# Patient Record
Sex: Male | Born: 1995 | Race: Black or African American | Hispanic: No | Marital: Single | Smoking: Never smoker
Health system: Southern US, Community
[De-identification: ages and names within clinical notes are randomized; demographics above are authoritative.]

---
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
## 2015-07-17 ENCOUNTER — Emergency Department (HOSPITAL_COMMUNITY)
Admission: EM | Admit: 2015-07-17 | Discharge: 2015-07-17 | Disposition: A | Discharge status: Home / Self Care | Payer: Medicaid Other | Attending: Emergency Medicine | Admitting: Emergency Medicine

## 2015-07-17 ENCOUNTER — Encounter (HOSPITAL_COMMUNITY): Payer: Self-pay | Admitting: Emergency Medicine

## 2015-07-17 DIAGNOSIS — J029 Acute pharyngitis, unspecified: Secondary | ICD-10-CM

## 2015-07-17 LAB — RAPID STREP SCREEN (MED CTR MEBANE ONLY): Streptococcus, Group A Screen (Direct): NEGATIVE

## 2015-07-17 MED ORDER — IBUPROFEN 600 MG PO TABS: 600.0000 mg | ORAL_TABLET | Freq: Three times a day (TID) | ORAL | Status: AC | PRN

## 2015-07-17 MED ORDER — IBUPROFEN 800 MG PO TABS
800.0000 mg | ORAL_TABLET | Freq: Once | ORAL | Status: AC
  Administered 2015-07-17: 800 mg via ORAL
  Filled 2015-07-17: qty 1

## 2015-07-17 NOTE — ED Notes (Signed)
Pt complains of throat pain since last Tuesday. Pt reports that it hurts to swallow. Denies fevers, chills, body aches and cough.

## 2015-07-17 NOTE — ED Provider Notes (Signed)
CSN: [id number]     Arrival date & time 07/17/15  4098 History   First MD Initiated Contact with Patient 07/17/15 [phone]     Chief Complaint  Patient presents with  . Sore Throat     (Consider location/radiation/quality/duration/timing/severity/associated sxs/prior Treatment) Patient is a 20 y.o. male presenting with pharyngitis. The history is provided by the patient.  Sore Throat This is a new problem. Episode onset: started 9 days ago, sx free ytd, then woke today sore again. The problem occurs constantly. The problem has been gradually improving. Associated symptoms include a sore throat. Pertinent negatives include no abdominal pain, chest pain, chills, congestion, coughing, fever, myalgias, rash, swollen glands, vomiting or weakness. The symptoms are aggravated by swallowing. He has tried acetaminophen for the symptoms. The treatment provided mild relief.    History reviewed. No pertinent past medical history. Past Surgical History  Procedure Laterality Date  . Cystoscopy with urethral dilatation N/A 06/07/2013    Procedure: CYSTOSCOPY WITH URETHRAL DILATATION WITH FULGURATION OF PROSTATE;  Surgeon: Ky Barban, MD;  Location: [facility];  Service: Urology;  Laterality: N/A;  . Cystoscopy with biopsy N/A 06/07/2013    Procedure: CYSTOSCOPY WITH BIOPSY;  Surgeon: Ky Barban, MD;  Location: [facility];  Service: Urology;  Laterality: N/A;   History reviewed. No pertinent family history. Social History  Substance Use Topics  . Smoking status: Never Smoker   . Smokeless tobacco: None  . Alcohol Use: No    Review of Systems  Constitutional: Negative for fever and chills.  HENT: Positive for sore throat. Negative for congestion, ear pain, rhinorrhea, sinus pressure, trouble swallowing and voice change.   Eyes: Negative for discharge.  Respiratory: Negative for cough, shortness of breath, wheezing and stridor.   Cardiovascular: Negative for chest pain.  Gastrointestinal: Negative  for vomiting and abdominal pain.  Genitourinary: Negative.   Musculoskeletal: Negative for myalgias.  Skin: Negative for rash.  Neurological: Negative for weakness.      Allergies  Review of patient's allergies indicates no known allergies.  Home Medications   Prior to Admission medications   Medication Sig Start Date End Date Taking? Authorizing Provider  ibuprofen (ADVIL,MOTRIN) 600 MG tablet Take 1 tablet (600 mg total) by mouth every 8 (eight) hours as needed for moderate pain. 07/17/15   Burgess Amor, PA-C   BP 135/82 mmHg  Pulse 60  Temp(Src) 98 F (36.7 C) (Oral)  Resp 16  Ht  (1.778 m)  Wt 78.019 kg  BMI 24.68 kg/m2  SpO2 100% Physical Exam  Constitutional: He is oriented to person, place, and time. He appears well-developed and well-nourished.  HENT:  Head: Normocephalic and atraumatic.  Right Ear: Tympanic membrane and ear canal normal.  Left Ear: Tympanic membrane and ear canal normal.  Nose: No mucosal edema or rhinorrhea.  Mouth/Throat: Uvula is midline and mucous membranes are normal. No trismus in the jaw. Posterior oropharyngeal erythema present. No oropharyngeal exudate, posterior oropharyngeal edema or tonsillar abscesses.  Mild posterior pharyngeal erythema. No tonsillar edema. No exudate or petechiae.   Eyes: Conjunctivae are normal.  Cardiovascular: Normal rate and normal heart sounds.   Pulmonary/Chest: Effort normal. No respiratory distress. He has no wheezes. He has no rales.  Musculoskeletal: Normal range of motion.  Lymphadenopathy:    He has no cervical adenopathy.  No head or neck adenopathy.  Neurological: He is alert and oriented to person, place, and time.  Skin: Skin is warm and dry. No rash noted.  Psychiatric: He  has a normal mood and affect.    ED Course  Procedures (including critical care time) Labs Review Labs Reviewed  RAPID STREP SCREEN (NOT AT [facility])  CULTURE, GROUP A STREP [facility])      MDM   Final diagnoses:   Viral pharyngitis    Strep negative, discussed with patient.  Culture pending, suspect viral pharygitis. He was given ibuprofen while here with near complete resolution of throat pain. Advised to continue this medicine, throat lozenges, increased fluids, rest.  Prn f/u anticipated.   Burgess AmorJulie Londen Bok, PA-C 07/17/15 2104  Vanetta MuldersScott Zackowski, MD 07/18/15 [id number]

## 2015-07-17 NOTE — Discharge Instructions (Signed)
Pharyngitis Pharyngitis is redness, pain, and swelling (inflammation) of your pharynx.  CAUSES  Pharyngitis is usually caused by infection. Most of the time, these infections are from viruses (viral) and are part of a cold. However, sometimes pharyngitis is caused by bacteria (bacterial). Pharyngitis can also be caused by allergies. Viral pharyngitis may be spread from person to person by coughing, sneezing, and personal items or utensils (cups, forks, spoons, toothbrushes). Bacterial pharyngitis may be spread from person to person by more intimate contact, such as kissing.  SIGNS AND SYMPTOMS  Symptoms of pharyngitis include:   Sore throat.   Tiredness (fatigue).   Low-grade fever.   Headache.  Joint pain and muscle aches.  Skin rashes.  Swollen lymph nodes.  Plaque-like film on throat or tonsils (often seen with bacterial pharyngitis). DIAGNOSIS  Your health care provider will ask you questions about your illness and your symptoms. Your medical history, along with a physical exam, is often all that is needed to diagnose pharyngitis. Sometimes, a rapid strep test is done. Other lab tests may also be done, depending on the suspected cause.  TREATMENT  Viral pharyngitis will usually get better in 3-4 days without the use of medicine. Bacterial pharyngitis is treated with medicines that kill germs (antibiotics).  HOME CARE INSTRUCTIONS   Drink enough water and fluids to keep your urine clear or pale yellow.   Only take over-the-counter or prescription medicines as directed by your health care provider:   If you are prescribed antibiotics, make sure you finish them even if you start to feel better.   Do not take aspirin.   Get lots of rest.   Gargle with 8 oz of salt water ( tsp of salt per 1 qt of water) as often as every 1-2 hours to soothe your throat.   Throat lozenges (if you are not at risk for choking) or sprays may be used to soothe your throat. SEEK MEDICAL  CARE IF:   You have large, tender lumps in your neck.  You have a rash.  You cough up green, yellow-brown, or bloody spit. SEEK IMMEDIATE MEDICAL CARE IF:   Your neck becomes stiff.  You drool or are unable to swallow liquids.  You vomit or are unable to keep medicines or liquids down.  You have severe pain that does not go away with the use of recommended medicines.  You have trouble breathing (not caused by a stuffy nose). MAKE SURE YOU:   Understand these instructions.  Will watch your condition.  Will get help right away if you are not doing well or get worse.   This information is not intended to replace advice given to you by your health care provider. Make sure you discuss any questions you have with your health care provider.   Document Released: 04/12/2005 Document Revised: 01/31/2013 Document Reviewed: 12/18/2012 Elsevier Interactive Patient Education 2016 [employer]   Your strep test is negative today for a bacterial source of your sore throat pain.  Your symptoms are most likely a virus (which is contagious) but should resolve on its own with a little more time.  Cough lozenges, warm tea with lemon and honey can be soothing.  Ibuprofen can also help with throat pain.

## 2015-07-19 LAB — CULTURE, GROUP A STREP (THRC)

## 2018-05-31 ENCOUNTER — Emergency Department (HOSPITAL_COMMUNITY)
Admission: EM | Admit: 2018-05-31 | Discharge: 2018-05-31 | Disposition: A | Discharge status: Home / Self Care | Payer: 59 | Attending: Emergency Medicine | Admitting: Emergency Medicine

## 2018-05-31 ENCOUNTER — Other Ambulatory Visit: Payer: Self-pay

## 2018-05-31 ENCOUNTER — Encounter (HOSPITAL_COMMUNITY): Payer: Self-pay | Admitting: Emergency Medicine

## 2018-05-31 DIAGNOSIS — R109 Unspecified abdominal pain: Secondary | ICD-10-CM | POA: Diagnosis not present

## 2018-05-31 DIAGNOSIS — Z5321 Procedure and treatment not carried out due to patient leaving prior to being seen by health care provider: Secondary | ICD-10-CM | POA: Insufficient documentation

## 2018-05-31 LAB — COMPREHENSIVE METABOLIC PANEL
ALT: 13 U/L (ref 0–44)
AST: 21 U/L (ref 15–41)
Albumin: 4.2 g/dL (ref 3.5–5.0)
Alkaline Phosphatase: 42 U/L (ref 38–126)
Anion gap: 7 (ref 5–15)
BILIRUBIN TOTAL: 0.8 mg/dL (ref 0.3–1.2)
BUN: 13 mg/dL (ref 6–20)
CO2: 25 mmol/L (ref 22–32)
CREATININE: 1.22 mg/dL (ref 0.61–1.24)
Calcium: 8.8 mg/dL — ABNORMAL LOW (ref 8.9–10.3)
Chloride: 104 mmol/L (ref 98–111)
Glucose, Bld: 91 mg/dL (ref 70–99)
Potassium: 3.4 mmol/L — ABNORMAL LOW (ref 3.5–5.1)
Sodium: 136 mmol/L (ref 135–145)
TOTAL PROTEIN: 7.1 g/dL (ref 6.5–8.1)

## 2018-05-31 LAB — URINALYSIS, ROUTINE W REFLEX MICROSCOPIC
Bacteria, UA: NONE SEEN
Bilirubin Urine: NEGATIVE
Glucose, UA: NEGATIVE mg/dL
Ketones, ur: NEGATIVE mg/dL
NITRITE: NEGATIVE
PH: 5 (ref 5.0–8.0)
Protein, ur: 30 mg/dL — AB
SPECIFIC GRAVITY, URINE: 1.033 — AB (ref 1.005–1.030)
WBC, UA: >50 WBC/hpf — ABNORMAL HIGH (ref 0–5)

## 2018-05-31 LAB — CBC
HEMATOCRIT: 48 % (ref 39.0–52.0)
Hemoglobin: 15.9 g/dL (ref 13.0–17.0)
MCH: 28.5 pg (ref 26.0–34.0)
MCHC: 33.1 g/dL (ref 30.0–36.0)
MCV: 86.2 fL (ref 80.0–100.0)
NRBC: 0 % (ref 0.0–0.2)
PLATELETS: 293 10*3/uL (ref 150–400)
RBC: 5.57 MIL/uL (ref 4.22–5.81)
RDW: 12.4 % (ref 11.5–15.5)
WBC: 6.5 10*3/uL (ref 4.0–10.5)

## 2018-05-31 LAB — LIPASE, BLOOD: LIPASE: 23 U/L (ref 11–51)

## 2018-05-31 MED ORDER — SODIUM CHLORIDE 0.9% FLUSH: 3.0000 mL | Freq: Once | INTRAVENOUS | Status: DC

## 2018-05-31 NOTE — ED Triage Notes (Signed)
Vomiting and diarrhea since yesterday after eating at cookout

## 2018-05-31 NOTE — ED Notes (Signed)
Registration called and stated that pt left from waiting room.

## 2018-06-01 ENCOUNTER — Ambulatory Visit (HOSPITAL_COMMUNITY)
Admission: EM | Admit: 2018-06-01 | Discharge: 2018-06-01 | Disposition: A | Discharge status: Home / Self Care | Payer: 59 | Attending: Family Medicine | Admitting: Family Medicine

## 2018-06-01 ENCOUNTER — Encounter (HOSPITAL_COMMUNITY): Payer: Self-pay | Admitting: Family Medicine

## 2018-06-01 DIAGNOSIS — A084 Viral intestinal infection, unspecified: Secondary | ICD-10-CM

## 2018-06-01 MED ORDER — ONDANSETRON 4 MG PO TBDP: 4.0000 mg | ORAL_TABLET | Freq: Three times a day (TID) | ORAL | 0 refills | Status: AC | PRN

## 2018-06-01 NOTE — Discharge Instructions (Addendum)
I believe that this is either a stomach virus or food poisoning.  Zofran as needed for nausea, vomiting Advance diet as tolerated.  Sip fluids  Follow up as needed for continued or worsening symptoms

## 2018-06-01 NOTE — ED Provider Notes (Signed)
[facility]    CSN: [id number] Arrival date & time: 06/01/18  0802     History   Chief Complaint No chief complaint on file.   HPI Dorena CookeyJamond D [place] is a 23 y.o. male.   Patient is a 23 year old male with no significant past medical history.  He presents today with nausea, vomiting, diarrhea.  This is been present and somewhat improved since Sunday.  Reports that his symptoms started after eating cookout.  He has been able to drink water and orange juice without vomiting.  The vomiting happens after he tries to eat certain foods.  He has been eating approximately 1 small meal a day.  He has not taken any medications for his symptoms.  He denies any recent traveling.  He does work 2 jobs and works with [employer].  He denies any associated fever, myalgias.  ROS per HPI      History reviewed. No pertinent past medical history.  There are no active problems to display for this patient.   Past Surgical History:  Procedure Laterality Date  . CYSTOSCOPY WITH BIOPSY N/A 06/07/2013   Procedure: CYSTOSCOPY WITH BIOPSY;  Surgeon: Ky BarbanMohammad I Javaid, MD;  Location: [facility];  Service: Urology;  Laterality: N/A;  . CYSTOSCOPY WITH URETHRAL DILATATION N/A 06/07/2013   Procedure: CYSTOSCOPY WITH URETHRAL DILATATION WITH FULGURATION OF PROSTATE;  Surgeon: Ky BarbanMohammad I Javaid, MD;  Location: [facility];  Service: Urology;  Laterality: N/A;       Home Medications    Prior to Admission medications   Medication Sig Start Date End Date Taking? Authorizing Provider  ibuprofen (ADVIL,MOTRIN) 600 MG tablet Take 1 tablet (600 mg total) by mouth every 8 (eight) hours as needed for moderate pain. 07/17/15   Burgess AmorIdol, Julie, PA-C  ondansetron (ZOFRAN ODT) 4 MG disintegrating tablet Take 1 tablet (4 mg total) by mouth every 8 (eight) hours as needed for nausea or vomiting. 06/01/18   Janace ArisBast, Geral Tuch A, NP    Family History No family history on file.  Social History Social History   Tobacco Use  .  Smoking status: Never Smoker  . Smokeless tobacco: Never Used  Substance Use Topics  . Alcohol use: No  . Drug use: No     Allergies   Patient has no known allergies.   Review of Systems Review of Systems   Physical Exam Triage Vital Signs ED Triage Vitals [06/01/18 0815]  Enc Vitals Group     BP 122/65     Pulse Rate 69     Resp 16     Temp 98.4 F (36.9 C)     Temp Source Oral     SpO2 99 %     Weight      Height      Head Circumference      Peak Flow      Pain Score      Pain Loc      Pain Edu?      Excl. in GC?    No data found.  Updated Vital Signs BP 122/65 (BP Location: Left Arm)   Pulse 69   Temp 98.4 F (36.9 C) (Oral)   Resp 16   SpO2 99%   Visual Acuity Right Eye Distance:   Left Eye Distance:   Bilateral Distance:    Right Eye Near:   Left Eye Near:    Bilateral Near:     Physical Exam Vitals signs and nursing note reviewed.  Constitutional:  General: He is not in acute distress.    Appearance: Normal appearance. He is well-developed and normal weight. He is not ill-appearing, toxic-appearing or diaphoretic.  HENT:     Head: Normocephalic and atraumatic.     Nose: Nose normal.  Eyes:     Conjunctiva/sclera: Conjunctivae normal.  Neck:     Musculoskeletal: Normal range of motion.  Cardiovascular:     Rate and Rhythm: Normal rate and regular rhythm.     Heart sounds: No murmur.  Pulmonary:     Effort: Pulmonary effort is normal. No respiratory distress.     Breath sounds: Normal breath sounds.  Abdominal:     Palpations: Abdomen is soft.     Tenderness: There is no abdominal tenderness.  Musculoskeletal: Normal range of motion.  Skin:    General: Skin is warm and dry.  Neurological:     Mental Status: He is alert.  Psychiatric:        Mood and Affect: Mood normal.      UC Treatments / Results  Labs (all labs ordered are listed, but only abnormal results are displayed) Labs Reviewed - No data to  display  EKG None  Radiology No results found.  Procedures Procedures (including critical care time)  Medications Ordered in UC Medications - No data to display  Initial Impression / Assessment and Plan / UC Course  I have reviewed the triage vital signs and the nursing notes.  Pertinent labs & imaging results that were available during my care of the patient were reviewed by me and considered in my medical decision making (see chart for details).     Patient is a 23 year old male with nausea, vomiting, diarrhea since Sunday.  This started after eating fast food. Most likely symptoms are related to either a virus or food poisoning.  No acute abdomen on exam.  Vital signs stable, nontoxic or ill-appearing.  We will give Zofran for nausea, vomiting to use as needed Work note given  Final Clinical Impressions(s) / UC Diagnoses   Final diagnoses:  Viral gastroenteritis     Discharge Instructions     I believe that this is either a stomach virus or food poisoning.  Zofran as needed for nausea, vomiting Advance diet as tolerated.  Sip fluids  Follow up as needed for continued or worsening symptoms     ED Prescriptions    Medication Sig Dispense Auth. Provider   ondansetron (ZOFRAN ODT) 4 MG disintegrating tablet Take 1 tablet (4 mg total) by mouth every 8 (eight) hours as needed for nausea or vomiting. 20 tablet Dahlia Byes A, NP     Controlled Substance Prescriptions [place] Controlled Substance Registry consulted? Not Applicable   Janace Aris, NP 06/01/18 [phone]

## 2019-06-04 ENCOUNTER — Ambulatory Visit: Payer: Self-pay | Attending: Internal Medicine

## 2019-06-04 ENCOUNTER — Other Ambulatory Visit: Payer: Self-pay

## 2019-06-04 DIAGNOSIS — Z20822 Contact with and (suspected) exposure to covid-19: Secondary | ICD-10-CM | POA: Insufficient documentation

## 2019-06-05 LAB — NOVEL CORONAVIRUS, NAA: SARS-CoV-2, NAA: NOT DETECTED

## 2020-05-02 ENCOUNTER — Other Ambulatory Visit: Payer: Self-pay

## 2020-05-02 DIAGNOSIS — Z20822 Contact with and (suspected) exposure to covid-19: Secondary | ICD-10-CM

## 2020-05-05 LAB — NOVEL CORONAVIRUS, NAA: SARS-CoV-2, NAA: DETECTED — AB

## 2020-10-16 ENCOUNTER — Other Ambulatory Visit: Payer: Self-pay

## 2020-10-16 ENCOUNTER — Emergency Department (HOSPITAL_COMMUNITY)
Admission: EM | Admit: 2020-10-16 | Discharge: 2020-10-16 | Disposition: A | Discharge status: Home / Self Care | Payer: No Typology Code available for payment source | Attending: Emergency Medicine | Admitting: Emergency Medicine

## 2020-10-16 ENCOUNTER — Emergency Department (HOSPITAL_COMMUNITY): Payer: No Typology Code available for payment source

## 2020-10-16 ENCOUNTER — Encounter (HOSPITAL_COMMUNITY): Payer: Self-pay

## 2020-10-16 DIAGNOSIS — Z23 Encounter for immunization: Secondary | ICD-10-CM | POA: Insufficient documentation

## 2020-10-16 DIAGNOSIS — S0181XA Laceration without foreign body of other part of head, initial encounter: Secondary | ICD-10-CM | POA: Insufficient documentation

## 2020-10-16 MED ORDER — LIDOCAINE HCL (PF) 1 % IJ SOLN
INTRAMUSCULAR | Status: DC
Start: 2020-10-16 — End: 2020-10-17
  Filled 2020-10-16: qty 5

## 2020-10-16 MED ORDER — CEPHALEXIN 500 MG PO CAPS: 500.0000 mg | ORAL_CAPSULE | Freq: Two times a day (BID) | ORAL | 0 refills | Status: AC

## 2020-10-16 MED ORDER — LIDOCAINE HCL (PF) 1 % IJ SOLN
INTRAMUSCULAR | Status: AC
  Filled 2020-10-16: qty 5

## 2020-10-16 MED ORDER — TETANUS-DIPHTH-ACELL PERTUSSIS 5-2.5-18.5 LF-MCG/0.5 IM SUSY
0.5000 mL | PREFILLED_SYRINGE | Freq: Once | INTRAMUSCULAR | Status: AC
  Administered 2020-10-16: 0.5 mL via INTRAMUSCULAR
  Filled 2020-10-16: qty 0.5

## 2020-10-16 MED ORDER — LIDOCAINE HCL (PF) 1 % IJ SOLN
5.0000 mL | Freq: Once | INTRAMUSCULAR | Status: AC
  Administered 2020-10-16: 5 mL

## 2020-10-16 NOTE — Discharge Instructions (Addendum)
You received 15 sutures, I recommend keeping the wound dry for the first 24 hours.  After that I would like you to rinse out the wounds with clean water daily for the next 7 days.  I recommend over-the-counter pain medications like ibuprofen and/or Tylenol every 6 hours as needed please follow dosing on the back of bottle.  I start you on antibiotics please take as prescribed.  Please come back next 2 to 3 days for a wound check.  Come back to the emergency department if you develop chest pain, shortness of breath, severe abdominal pain, uncontrolled nausea, vomiting, diarrhea.
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
## 2020-10-16 NOTE — ED Notes (Signed)
Mother to registration asked to see patient, pt denied letting mother come to room at this time, registration made aware.
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
## 2020-10-16 NOTE — ED Notes (Signed)
Patient transported to X-ray 

## 2020-10-20 ENCOUNTER — Encounter (HOSPITAL_COMMUNITY): Payer: Self-pay | Admitting: *Deleted

## 2020-10-20 ENCOUNTER — Emergency Department (HOSPITAL_COMMUNITY)
Admission: EM | Admit: 2020-10-20 | Discharge: 2020-10-20 | Discharge status: Against Medical Advice | Payer: No Typology Code available for payment source | Attending: Emergency Medicine | Admitting: Emergency Medicine

## 2020-10-20 ENCOUNTER — Other Ambulatory Visit: Payer: Self-pay

## 2020-10-20 DIAGNOSIS — Z4800 Encounter for change or removal of nonsurgical wound dressing: Secondary | ICD-10-CM | POA: Insufficient documentation

## 2020-10-20 DIAGNOSIS — Z5321 Procedure and treatment not carried out due to patient leaving prior to being seen by health care provider: Secondary | ICD-10-CM | POA: Insufficient documentation

## 2020-10-20 NOTE — ED Notes (Signed)
PA informed

## 2020-10-20 NOTE — ED Provider Notes (Signed)
Patient left prior to my evaluation.  Left AGAINST MEDICAL ADVICE.  Seen leaving the ER with normal steady gait.   Mare Ferrari, PA-C 10/20/20 Cecilie Kicks, MD 10/23/20 1400
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
## 2020-10-22 ENCOUNTER — Encounter (HOSPITAL_COMMUNITY): Payer: Self-pay

## 2020-10-22 ENCOUNTER — Emergency Department (HOSPITAL_COMMUNITY)
Admission: EM | Admit: 2020-10-22 | Discharge: 2020-10-22 | Disposition: A | Discharge status: Home / Self Care | Payer: No Typology Code available for payment source | Attending: Emergency Medicine | Admitting: Emergency Medicine

## 2020-10-22 ENCOUNTER — Other Ambulatory Visit: Payer: Self-pay

## 2020-10-22 DIAGNOSIS — Z4802 Encounter for removal of sutures: Secondary | ICD-10-CM | POA: Insufficient documentation

## 2020-10-22 NOTE — ED Provider Notes (Signed)
[facility] EMERGENCY DEPARTMENT Provider Note   CSN: [id number] Arrival date & time: 10/22/20  1627     History Chief Complaint  Patient presents with   Wound Check    [name] [name] is a 25 y.o. male.  HPI  25 year old male presenting to the emergency department today for evaluation of suture removal.  Had laceration to the left jawline with sutures placed on 6/23.  He states he has had no pain and that the wound has been well-healing.  Denies any fevers or drainage from the wound.  History reviewed. No pertinent past medical history.  There are no problems to display for this patient.   Past Surgical History:  Procedure Laterality Date   CYSTOSCOPY WITH BIOPSY N/A 06/07/2013   Procedure: CYSTOSCOPY WITH BIOPSY;  Surgeon: Ky Barban, MD;  Location: [facility];  Service: Urology;  Laterality: N/A;   CYSTOSCOPY WITH URETHRAL DILATATION N/A 06/07/2013   Procedure: CYSTOSCOPY WITH URETHRAL DILATATION WITH FULGURATION OF PROSTATE;  Surgeon: Ky Barban, MD;  Location: [facility];  Service: Urology;  Laterality: N/A;       History reviewed. No pertinent family history.  Social History   Tobacco Use   Smoking status: Never   Smokeless tobacco: Never  Vaping Use   Vaping Use: Never used  Substance Use Topics   Alcohol use: No   Drug use: No    Home Medications Prior to Admission medications   Medication Sig Start Date End Date Taking? Authorizing Provider  cephALEXin (KEFLEX) 500 MG capsule Take 1 capsule (500 mg total) by mouth 2 (two) times daily for 7 days. 10/16/20 10/23/20  Carroll Sage, PA-C  ibuprofen (ADVIL,MOTRIN) 600 MG tablet Take 1 tablet (600 mg total) by mouth every 8 (eight) hours as needed for moderate pain. 07/17/15   Burgess Amor, PA-C  ondansetron (ZOFRAN ODT) 4 MG disintegrating tablet Take 1 tablet (4 mg total) by mouth every 8 (eight) hours as needed for nausea or vomiting. 06/01/18   Janace Aris, NP    Allergies    Patient has no known  allergies.  Review of Systems   Review of Systems  Constitutional:  Negative for fever.  Skin:  Positive for wound.  Neurological:  Negative for headaches.   Physical Exam Updated Vital Signs BP (!) 145/74 (BP Location: Right Arm)   Pulse 64   Temp 98.6 F (37 C) (Oral)   Resp 18   Ht 5\' 10"  (1.778 m)   Wt 76.9 kg   SpO2 98%   BMI 24.32 kg/m   Physical Exam Constitutional:      General: He is not in acute distress.    Appearance: He is well-developed.  Eyes:     Conjunctiva/sclera: Conjunctivae normal.  Cardiovascular:     Rate and Rhythm: Normal rate.  Pulmonary:     Effort: Pulmonary effort is normal.  Skin:    General: Skin is warm and dry.     Comments: Well-healing wound to the left jawline with no significant tenderness, surrounding erythema or warmth.  Neurological:     Mental Status: He is alert and oriented to person, place, and time.    ED Results / Procedures / Treatments   Labs (all labs ordered are listed, but only abnormal results are displayed) Labs Reviewed - No data to display  EKG None  Radiology No results found.  Procedures .Suture Removal  Date/Time: 10/22/2020 5:46 PM Performed by: 10/24/2020, PA-C Authorized by: Karrie Meres,  PA-C   Consent:    Consent obtained:  Verbal   Consent given by:  Patient   Risks, benefits, and alternatives were discussed: yes     Risks discussed:  Bleeding and wound separation   Alternatives discussed:  No treatment Universal protocol:    Procedure explained and questions answered to patient or proxy's satisfaction: yes     Immediately prior to procedure, a time out was called: yes     Patient identity confirmed:  Verbally with patient Location:    Location:  Head/neck Procedure details:    Wound appearance:  No signs of infection, good wound healing and clean   Number of sutures removed:  15 Post-procedure details:    Post-removal:  No dressing applied   Procedure completion:   Tolerated well, no immediate complications   Medications Ordered in ED Medications - No data to display  ED Course  I have reviewed the triage vital signs and the nursing notes.  Pertinent labs & imaging results that were available during my care of the patient were reviewed by me and considered in my medical decision making (see chart for details).    MDM Rules/Calculators/A&P                          Staple removal   Pt to ER for staple/suture removal and wound check as above. Procedure tolerated well. Vitals normal, no signs of infection. Scar minimization & return precautions given at dc.    Final Clinical Impression(s) / ED Diagnoses Final diagnoses:  Visit for suture removal    Rx / DC Orders ED Discharge Orders     None        Rayne Du 10/22/20 1747    Bethann Berkshire, MD 10/24/20 1032

## 2020-10-22 NOTE — ED Notes (Signed)
Stitches removed . No drainage noted.

## 2020-10-22 NOTE — ED Triage Notes (Signed)
Pt to er, pt states that Thursday he had stitches put in after he was cut with a glass bottle, states that Monday he was told to come in for a wound check, states that he wasn't able to come Monday, so he is here today.

## 2022-06-03 ENCOUNTER — Emergency Department (HOSPITAL_BASED_OUTPATIENT_CLINIC_OR_DEPARTMENT_OTHER): Payer: Self-pay

## 2022-06-03 ENCOUNTER — Other Ambulatory Visit: Payer: Self-pay

## 2022-06-03 ENCOUNTER — Encounter (HOSPITAL_BASED_OUTPATIENT_CLINIC_OR_DEPARTMENT_OTHER): Payer: Self-pay

## 2022-06-03 ENCOUNTER — Emergency Department (HOSPITAL_BASED_OUTPATIENT_CLINIC_OR_DEPARTMENT_OTHER)
Admission: EM | Admit: 2022-06-03 | Discharge: 2022-06-03 | Disposition: A | Discharge status: Home / Self Care | Payer: Worker's Compensation | Attending: Emergency Medicine | Admitting: Emergency Medicine

## 2022-06-03 DIAGNOSIS — S91111A Laceration without foreign body of right great toe without damage to nail, initial encounter: Secondary | ICD-10-CM | POA: Diagnosis not present

## 2022-06-03 DIAGNOSIS — Y990 Civilian activity done for income or pay: Secondary | ICD-10-CM | POA: Diagnosis not present

## 2022-06-03 DIAGNOSIS — S97111A Crushing injury of right great toe, initial encounter: Secondary | ICD-10-CM

## 2022-06-03 DIAGNOSIS — W3182XA Contact with other commercial machinery, initial encounter: Secondary | ICD-10-CM | POA: Diagnosis not present

## 2022-06-03 MED ORDER — TRAMADOL HCL 50 MG PO TABS: 50.0000 mg | ORAL_TABLET | Freq: Four times a day (QID) | ORAL | 0 refills | Status: AC | PRN

## 2022-06-03 MED ORDER — CEPHALEXIN 500 MG PO CAPS: 500.0000 mg | ORAL_CAPSULE | Freq: Four times a day (QID) | ORAL | 0 refills | Status: AC

## 2022-06-03 MED ORDER — LIDOCAINE-EPINEPHRINE (PF) 2 %-1:200000 IJ SOLN
20.0000 mL | Freq: Once | INTRAMUSCULAR | Status: DC
  Filled 2022-06-03: qty 20

## 2022-06-03 NOTE — ED Triage Notes (Signed)
States the bucket on a bobcat and he was scooping mulch into it and it didn't have the brake on it and it was rolling and caught his foot. States great toe nail on right foot is coming off.

## 2022-06-03 NOTE — ED Provider Notes (Signed)
[place] EMERGENCY DEPARTMENT AT [facility] Provider Note   CSN: [id number] Arrival date & time: 06/03/22  1558     History  Chief Complaint  Patient presents with   Foot Pain    [name] [name] is a 27 y.o. male complaining of injury to the right great toe that occurred while he was at work.  He states that the bucket of a bobcat was not fully down to the ground and the brake was not on causing it to roll forward and catch his foot.  He states it happened so quickly he was unable to pull his foot away.  He has pain in his great toe and is concerned that his toenail is coming off.  Denies any other injury from the incident, numbness or tingling in the foot, or difficulty controlling the bleeding.  He does not take anticoagulation.  He is up-to-date on tetanus immunization.         Home Medications Prior to Admission medications   Medication Sig Start Date End Date Taking? Authorizing Provider  cephALEXin (KEFLEX) 500 MG capsule Take 1 capsule (500 mg total) by mouth 4 (four) times daily. 06/03/22  Yes Danijah Noh R, PA  traMADol (ULTRAM) 50 MG tablet Take 1 tablet (50 mg total) by mouth every 6 (six) hours as needed. 06/03/22  Yes Elianys Conry R, PA  ibuprofen (ADVIL,MOTRIN) 600 MG tablet Take 1 tablet (600 mg total) by mouth every 8 (eight) hours as needed for moderate pain. 07/17/15   Evalee Jefferson, PA-C  ondansetron (ZOFRAN ODT) 4 MG disintegrating tablet Take 1 tablet (4 mg total) by mouth every 8 (eight) hours as needed for nausea or vomiting. 06/01/18   Orvan July, NP      Allergies    Patient has no known allergies.    Review of Systems   Review of Systems  Skin:  Positive for wound (Right great toe).    Physical Exam Updated Vital Signs BP (!) 148/98 (BP Location: Left Arm)   Pulse 76   Temp 98.3 F (36.8 C) (Oral)   Resp 18   Ht 5' 10"$  (1.778 m)   Wt 76.7 kg   SpO2 100%   BMI 24.25 kg/m  Physical Exam Vitals and nursing note reviewed.   Constitutional:      General: He is not in acute distress.    Appearance: Normal appearance. He is not ill-appearing or diaphoretic.  Cardiovascular:     Rate and Rhythm: Normal rate and regular rhythm.  Pulmonary:     Effort: Pulmonary effort is normal.  Musculoskeletal:     Right foot: Normal range of motion and normal capillary refill. Swelling, laceration, tenderness and bony tenderness present. No deformity. Normal pulse.     Comments: Swelling, tenderness, and minimal bleeding to the right great toe.  Neurovascularly intact.  There are superficial lacerations around the nailbed.  Toenail feels slightly loose and is painful to touch.  No other injuries to the right foot appreciated on exam.  Neurological:     Mental Status: He is alert. Mental status is at baseline.  Psychiatric:        Mood and Affect: Mood normal.        Behavior: Behavior normal.     ED Results / Procedures / Treatments   Labs (all labs ordered are listed, but only abnormal results are displayed) Labs Reviewed - No data to display  EKG None  Radiology DG Foot Complete Right  Result Date:  06/03/2022 CLINICAL DATA:  Injury from heavy machinery. 5 CT was rolling and caught patient's foot. Great toenail is coming off. Great toe pain. EXAM: RIGHT FOOT COMPLETE - 3+ VIEW COMPARISON:  None Available. FINDINGS: Mild soft tissue swelling of the great toe. Normal bone mineralization. Joint spaces are preserved. No definitive acute fracture is seen within the great toe on oblique view. On frontal view there is mild curvilinear lucency at the proximal lateral aspect of the distal phalanx, however this appears to extend outside the bone and likely reflects a soft tissue marking rather than an acute fracture line. No dislocation. IMPRESSION: Mild soft tissue swelling of the great toe. No definitive acute fracture is seen. Electronically Signed   By: Yvonne Kendall M.D.   On: 06/03/2022 16:45    Procedures Irrigation and  debridement  Date/Time: 06/03/2022 6:53 PM  Performed by: Pat Kocher, PA Authorized by: Pat Kocher, PA  Consent: Verbal consent obtained. Consent given by: patient Patient understanding: patient states understanding of the procedure being performed Patient identity confirmed: verbally with patient and arm band Local anesthesia used: yes Anesthesia: digital block  Anesthesia: Local anesthesia used: yes Local Anesthetic: lidocaine 2% with epinephrine Anesthetic total: 8 mL  Sedation: Patient sedated: no  Patient tolerance: patient tolerated the procedure well with no immediate complications Comments: Successful digital nerve block of right great toe with irrigation and debridement.  Trimmed damaged skin from around cuticle and nail bed.         Medications Ordered in ED Medications  lidocaine-EPINEPHrine (XYLOCAINE W/EPI) 2 %-1:200000 (PF) injection 20 mL (has no administration in time range)    ED Course/ Medical Decision Making/ A&P                             Medical Decision Making Amount and/or Complexity of Data Reviewed Radiology: ordered.  Risk Prescription drug management.   Patient presents to the ED with injury to the right foot from heavy machinery that occurred while at work.  He was able to control the bleeding.  He is complaining of pain to his right great toe with his toenail possibly being loose.  He is not on blood thinners.  His tetanus is up-to-date.  I ordered and personally interpreted x-ray imaging of right foot which shows soft tissue swelling but no acute dislocation or fracture.  Exam significant for tenderness, swelling, and minimal bleeding to the right great toe.  He is neurovascularly intact with normal range of motion of the toe.  No other injuries appreciated to the right foot.  The toenail does feel somewhat loose, but unable to fully examine due to patient experiencing pain.  Digital nerve block was successful with relief of  patient's pain.  I was able to thoroughly debride the area and trim away damaged skin.  Toenail is mostly still intact.  No obvious lacerations to the area that require repair.  Pain relief was achieved with digital block.  See procedure note.   Will put patient on prophylactic antibiotics given nature of injury and nailbed involvement.  Discussed wound care with patient and signs of infection to watch for.  Short prescription of pain medication given for severe pain.  Discussed NSAIDs for mild-moderate pain.  Patient verbalized his understanding.   The patient has been appropriately medically screened and/or stabilized in the ED. I have low suspicion for any other emergent medical condition which would require further screening, evaluation or treatment  in the ED or require inpatient management. At time of discharge the patient is hemodynamically stable and in no acute distress. I have discussed work-up results and diagnosis with patient and answered all questions. Patient is agreeable with discharge plan. We discussed strict return precautions for returning to the emergency department and they verbalized understanding.           Final Clinical Impression(s) / ED Diagnoses Final diagnoses:  Crushing injury of right great toe, initial encounter    Rx / DC Orders ED Discharge Orders          Ordered    cephALEXin (KEFLEX) 500 MG capsule  4 times daily        06/03/22 1901    traMADol (ULTRAM) 50 MG tablet  Every 6 hours PRN        06/03/22 1901              Pat Kocher, [place] 06/03/22 1901    Elgie Congo, MD 06/04/22 [phone]

## 2022-06-03 NOTE — Discharge Instructions (Addendum)
Thank you for allow me to be part of your care today.  Your x-ray was negative for broken bones in your foot.  Your toe may be numb for a few hours due to the injected numbing medicine.  Continue to keep this area clean and change the bandage regularly.  Your toenail was damaged and may fall off on its own in the process.  Monitor for signs of infection including significant swelling, increased warmth, redness, and drainage from around the toenail.  I recommend use of ibuprofen 600-800 mg every 6-8 hours as needed for mild to moderate pain.  I have sent a prescription for pain medicine to use for severe pain.

## 2022-06-22 IMAGING — DX DG MANDIBLE 4+V
4 series · 4 of 4 positions shown · non-contrast
Comparison: None.

CLINICAL DATA: Facial laceration on the left, initial encounter

EXAM:
MANDIBLE - 4+ VIEW

[mandible pa]
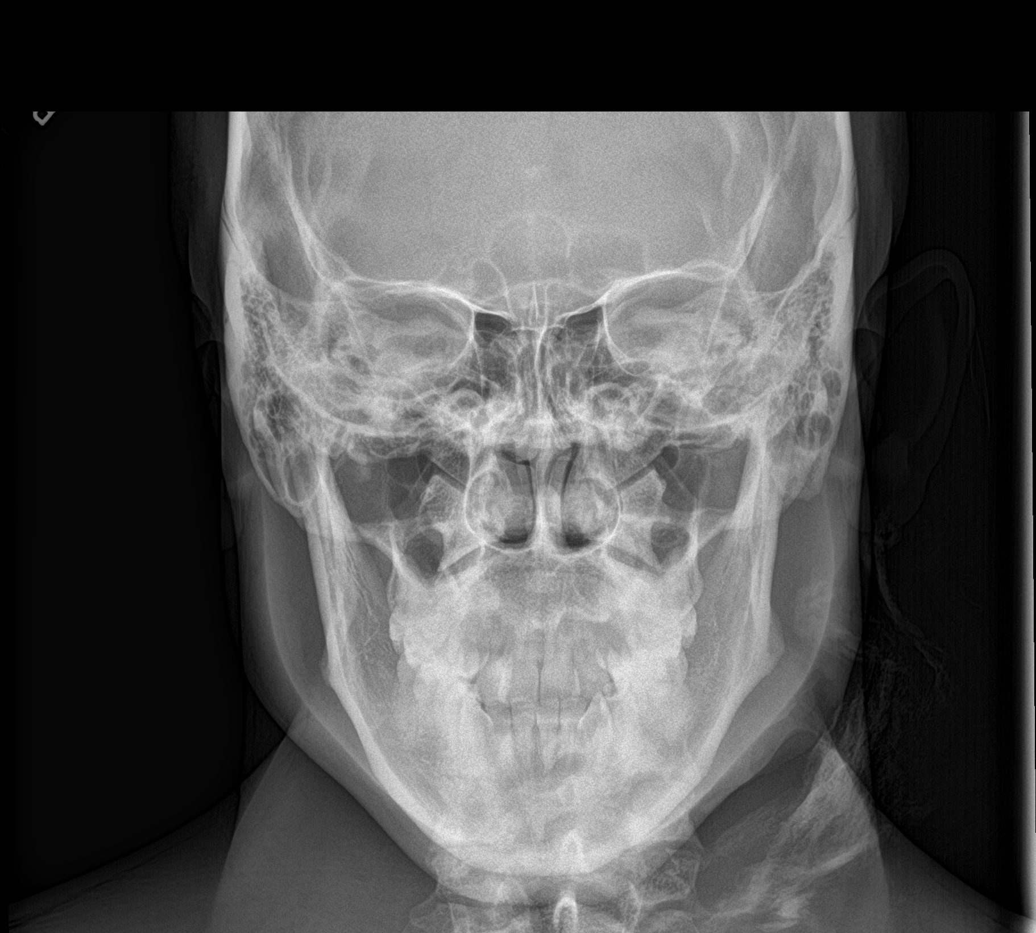

[mandible lat (1 of 2)]
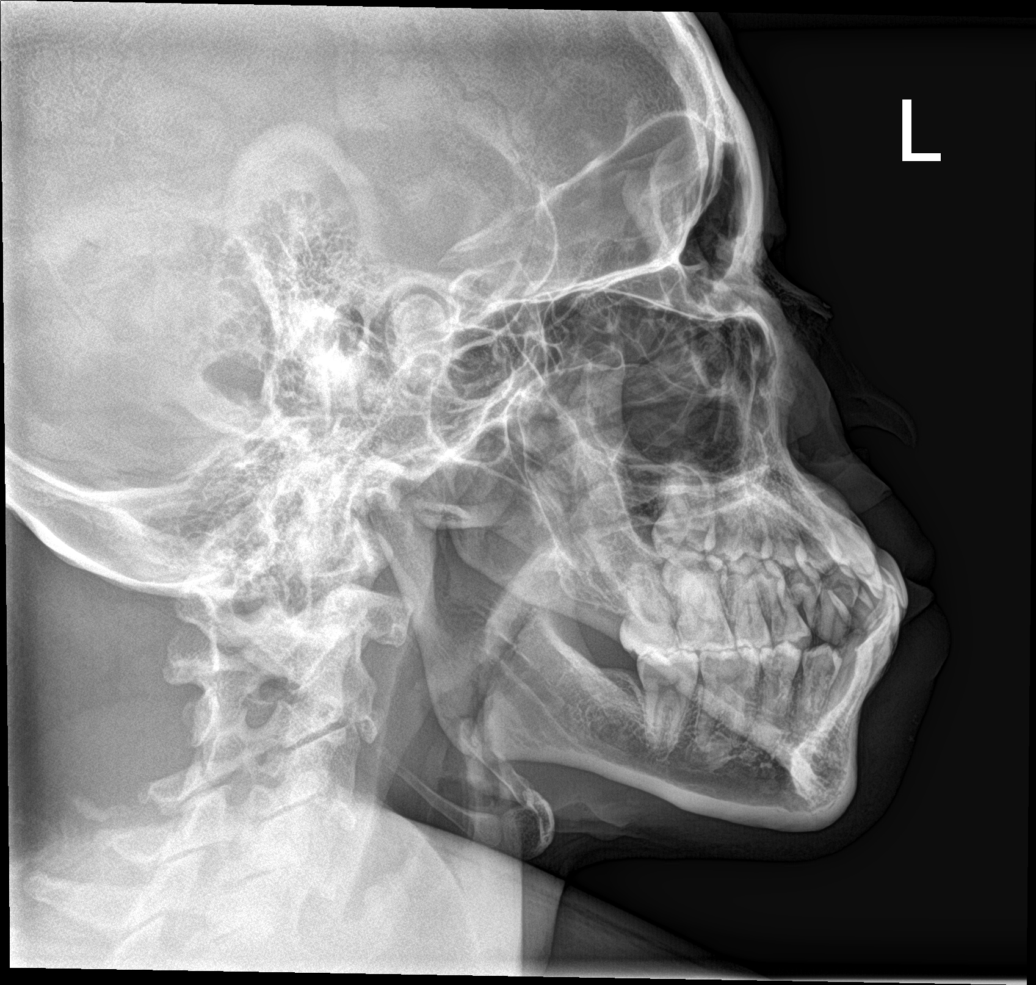

[mandible lat (2 of 2)]
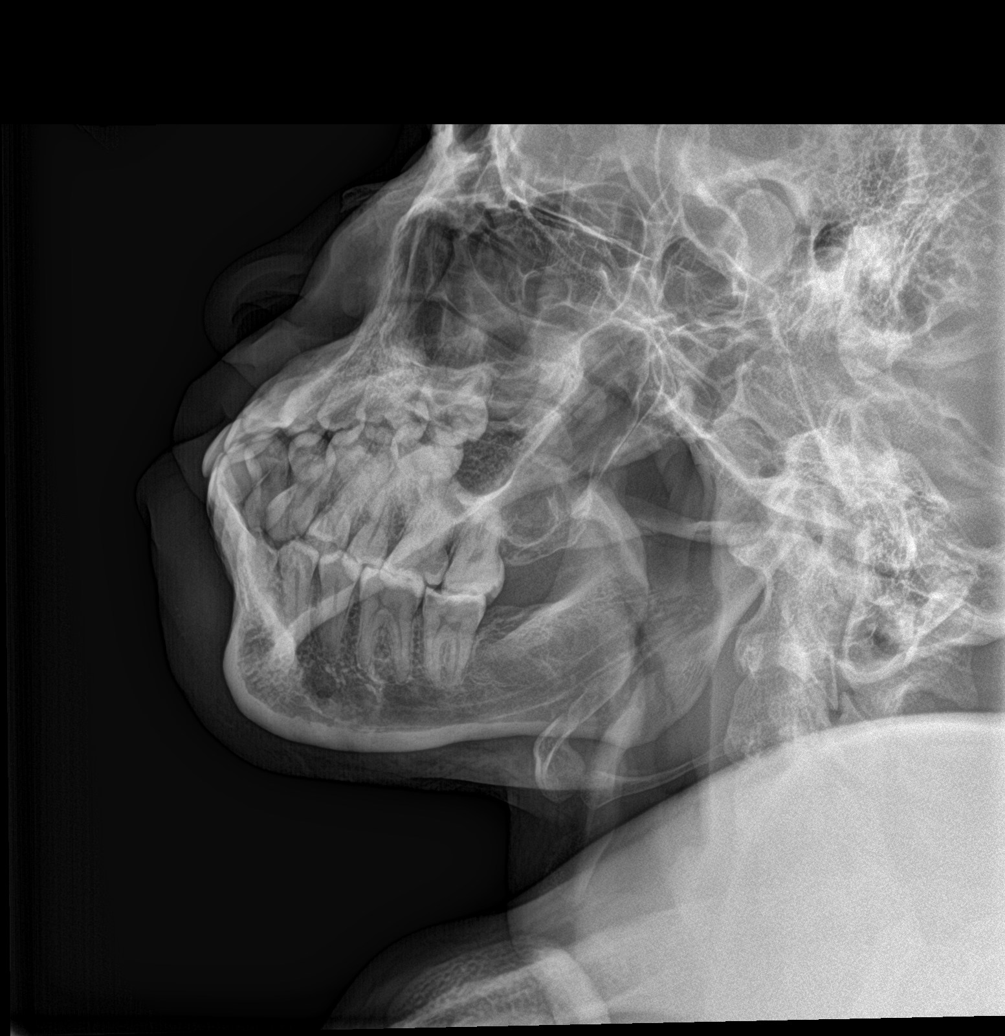

[mandible townes]
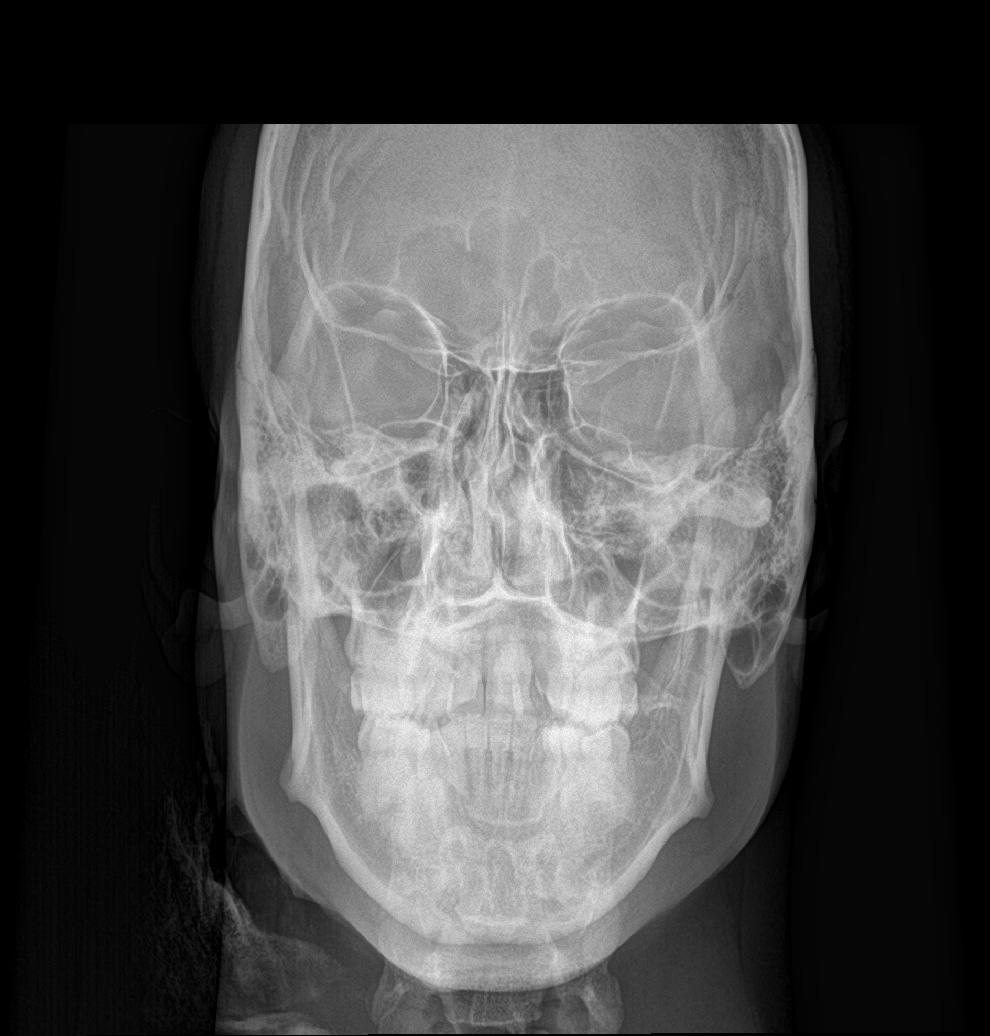

[4 of 4 positions shown; findings below may reference images not displayed]

FINDINGS: Mandible is well visualize without evidence of acute fracture or
dislocation. Mild soft tissue swelling is noted on the left
consistent with the given clinical history. No radiopaque foreign
body is seen. No other focal abnormality is noted.
IMPRESSION: Intact mandible.

Mild soft tissue swelling along the left jaw consistent with the
given clinical history.

No retained foreign body noted.

## 2023-05-22 ENCOUNTER — Emergency Department (HOSPITAL_COMMUNITY): Payer: Self-pay

## 2023-05-22 ENCOUNTER — Emergency Department (HOSPITAL_COMMUNITY)
Admission: EM | Admit: 2023-05-22 | Discharge: 2023-05-22 | Disposition: A | Discharge status: Home / Self Care | Payer: Self-pay | Attending: Emergency Medicine | Admitting: Emergency Medicine

## 2023-05-22 ENCOUNTER — Other Ambulatory Visit: Payer: Self-pay

## 2023-05-22 ENCOUNTER — Encounter (HOSPITAL_COMMUNITY): Payer: Self-pay

## 2023-05-22 DIAGNOSIS — R55 Syncope and collapse: Secondary | ICD-10-CM | POA: Insufficient documentation

## 2023-05-22 DIAGNOSIS — R001 Bradycardia, unspecified: Secondary | ICD-10-CM | POA: Insufficient documentation

## 2023-05-22 LAB — URINALYSIS, ROUTINE W REFLEX MICROSCOPIC
Bacteria, UA: NONE SEEN
Bilirubin Urine: NEGATIVE
Glucose, UA: NEGATIVE mg/dL
Hgb urine dipstick: NEGATIVE
Ketones, ur: NEGATIVE mg/dL
Nitrite: NEGATIVE
Protein, ur: NEGATIVE mg/dL
Specific Gravity, Urine: 1.026 (ref 1.005–1.030)
WBC, UA: >50 WBC/hpf (ref 0–5)
pH: 5 (ref 5.0–8.0)

## 2023-05-22 LAB — CBC WITH DIFFERENTIAL/PLATELET
Abs Immature Granulocytes: 0.02 10*3/uL (ref 0.00–0.07)
Basophils Absolute: 0.1 10*3/uL (ref 0.0–0.1)
Basophils Relative: 1 %
Eosinophils Absolute: 0.2 10*3/uL (ref 0.0–0.5)
Eosinophils Relative: 3 %
HCT: 40.8 % (ref 39.0–52.0)
Hemoglobin: 14.1 g/dL (ref 13.0–17.0)
Immature Granulocytes: 0 %
Lymphocytes Relative: 39 %
Lymphs Abs: 2 10*3/uL (ref 0.7–4.0)
MCH: 30 pg (ref 26.0–34.0)
MCHC: 34.6 g/dL (ref 30.0–36.0)
MCV: 86.8 fL (ref 80.0–100.0)
Monocytes Absolute: 0.4 10*3/uL (ref 0.1–1.0)
Monocytes Relative: 8 %
Neutro Abs: 2.5 10*3/uL (ref 1.7–7.7)
Neutrophils Relative %: 49 %
Platelets: 244 10*3/uL (ref 150–400)
RBC: 4.7 MIL/uL (ref 4.22–5.81)
RDW: 12.1 % (ref 11.5–15.5)
WBC: 5.1 10*3/uL (ref 4.0–10.5)
nRBC: 0 % (ref 0.0–0.2)

## 2023-05-22 LAB — COMPREHENSIVE METABOLIC PANEL
ALT: 15 U/L (ref 0–44)
AST: 24 U/L (ref 15–41)
Albumin: 3.1 g/dL — ABNORMAL LOW (ref 3.5–5.0)
Alkaline Phosphatase: 37 U/L — ABNORMAL LOW (ref 38–126)
Anion gap: 8 (ref 5–15)
BUN: 15 mg/dL (ref 6–20)
CO2: 24 mmol/L (ref 22–32)
Calcium: 8.3 mg/dL — ABNORMAL LOW (ref 8.9–10.3)
Chloride: 105 mmol/L (ref 98–111)
Creatinine, Ser: 1.33 mg/dL — ABNORMAL HIGH (ref 0.61–1.24)
GFR, Estimated: >60 mL/min (ref >60)
Glucose, Bld: 105 mg/dL — ABNORMAL HIGH (ref 70–99)
Potassium: 3.5 mmol/L (ref 3.5–5.1)
Sodium: 137 mmol/L (ref 135–145)
Total Bilirubin: 0.6 mg/dL (ref 0.0–1.2)
Total Protein: 5.2 g/dL — ABNORMAL LOW (ref 6.5–8.1)

## 2023-05-22 LAB — CBG MONITORING, ED: Glucose-Capillary: 113 mg/dL — ABNORMAL HIGH (ref 70–99)

## 2023-05-22 LAB — RAPID URINE DRUG SCREEN, HOSP PERFORMED
Amphetamines: NOT DETECTED
Barbiturates: NOT DETECTED
Benzodiazepines: NOT DETECTED
Cocaine: NOT DETECTED
Opiates: NOT DETECTED
Tetrahydrocannabinol: POSITIVE — AB

## 2023-05-22 LAB — LIPASE, BLOOD: Lipase: 28 U/L (ref 11–51)

## 2023-05-22 LAB — CK: Total CK: 789 U/L — ABNORMAL HIGH (ref 49–397)

## 2023-05-22 NOTE — Discharge Instructions (Addendum)
It was a pleasure caring for you today in the emergency department.  I am concerned for a possible seizure. A referral has been placed for neurology, please follow up with their office in [place]. Otherwise please review seizure precautions outlined below. Be sure to get plenty of rest over the next few days, drink plenty of fluids. Avoid alcohol and thc/marijuana. No smoking tobacco.  Please return to the emergency department for any worsening or worrisome symptoms.       Per [facility] statutes, patients with seizures are not allowed to drive until they have been seizure-free for six months.  Other recommendations include using caution when using heavy equipment or power tools. Avoid working on ladders or at heights. Take showers instead of baths.  Do not swim alone.  Ensure the water temperature is not too high on the home water heater. Do not go swimming alone. Do not lock yourself in a room alone (i.e. bathroom). When caring for infants or small children, sit down when holding, feeding, or changing them to minimize risk of injury to the child in the event you have a seizure. Maintain good sleep hygiene. Avoid alcohol.  Also recommend adequate sleep, hydration, good diet and minimize stress.     During the Seizure   - First, ensure adequate ventilation and place patients on the floor on their left side  Loosen clothing around the neck and ensure the airway is patent. If the patient is clenching the teeth, do not force the mouth open with any object as this can cause severe damage - Remove all items from the surrounding that can be hazardous. The patient may be oblivious to what's happening and may not even know what he or she is doing. If the patient is confused and wandering, either gently guide him/her away and block access to outside areas - Reassure the individual and be comforting - Call 911. In most cases, the seizure ends before EMS arrives. However, there are cases when  seizures may last over 3 to 5 minutes. Or the individual may have developed breathing difficulties or severe injuries. If a pregnant patient or a person with diabetes develops a seizure, it is prudent to call an ambulance. - Finally, if the patient does not regain full consciousness, then call EMS. Most patients will remain confused for about 45 to 90 minutes after a seizure, so you must use judgment in calling for help. - Avoid restraints but make sure the patient is in a bed with padded side rails - Place the individual in a lateral position with the neck slightly flexed; this will help the saliva drain from the mouth and prevent the tongue from falling backward - Remove all nearby furniture and other hazards from the area - Provide verbal assurance as the individual is regaining consciousness - Provide the patient with privacy if possible - Call for help and start treatment as ordered by the caregiver   After the Seizure (Postictal Stage)   After a seizure, most patients experience confusion, fatigue, muscle pain and/or a headache. Thus, one should permit the individual to sleep. For the next few days, reassurance is essential. Being calm and helping reorient the person is also of importance.   Most seizures are painless and end spontaneously. Seizures are not harmful to others but can lead to complications such as stress on the lungs, brain and the heart. Individuals with prior lung problems may develop labored breathing and respiratory distress.

## 2023-05-22 NOTE — ED Triage Notes (Signed)
Pt states he got out of bed tonight, feeling sick on his stomach, went to the restroom to vomit and had an unwitnessed syncopal episode. Mom found him unconscious in the restroom, diaphoretic, laying on the floor next to toilet. When pt woke, he vomited. Came to ED for evaluation. States this is the first time he's ever passed out.
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
## 2023-05-24 ENCOUNTER — Emergency Department (HOSPITAL_COMMUNITY)
Admission: EM | Admit: 2023-05-24 | Discharge: 2023-05-24 | Disposition: A | Discharge status: Home / Self Care | Payer: Self-pay | Attending: Emergency Medicine | Admitting: Emergency Medicine

## 2023-05-24 ENCOUNTER — Encounter (HOSPITAL_COMMUNITY): Payer: Self-pay | Admitting: *Deleted

## 2023-05-24 ENCOUNTER — Other Ambulatory Visit: Payer: Self-pay

## 2023-05-24 DIAGNOSIS — R112 Nausea with vomiting, unspecified: Secondary | ICD-10-CM | POA: Insufficient documentation

## 2023-05-24 DIAGNOSIS — R109 Unspecified abdominal pain: Secondary | ICD-10-CM | POA: Insufficient documentation

## 2023-05-24 LAB — COMPREHENSIVE METABOLIC PANEL
ALT: 17 U/L (ref 0–44)
AST: 28 U/L (ref 15–41)
Albumin: 3.9 g/dL (ref 3.5–5.0)
Alkaline Phosphatase: 45 U/L (ref 38–126)
Anion gap: 10 (ref 5–15)
BUN: 10 mg/dL (ref 6–20)
CO2: 23 mmol/L (ref 22–32)
Calcium: 8.9 mg/dL (ref 8.9–10.3)
Chloride: 106 mmol/L (ref 98–111)
Creatinine, Ser: 1.09 mg/dL (ref 0.61–1.24)
GFR, Estimated: >60 mL/min (ref >60)
Glucose, Bld: 107 mg/dL — ABNORMAL HIGH (ref 70–99)
Potassium: 3.9 mmol/L (ref 3.5–5.1)
Sodium: 139 mmol/L (ref 135–145)
Total Bilirubin: 0.7 mg/dL (ref 0.0–1.2)
Total Protein: 6.8 g/dL (ref 6.5–8.1)

## 2023-05-24 LAB — URINALYSIS, ROUTINE W REFLEX MICROSCOPIC
Bilirubin Urine: NEGATIVE
Glucose, UA: NEGATIVE mg/dL
Hgb urine dipstick: NEGATIVE
Ketones, ur: NEGATIVE mg/dL
Nitrite: NEGATIVE
Protein, ur: NEGATIVE mg/dL
Specific Gravity, Urine: 1.026 (ref 1.005–1.030)
pH: 5 (ref 5.0–8.0)

## 2023-05-24 LAB — CBC
HCT: 47.7 % (ref 39.0–52.0)
Hemoglobin: 16 g/dL (ref 13.0–17.0)
MCH: 29.3 pg (ref 26.0–34.0)
MCHC: 33.5 g/dL (ref 30.0–36.0)
MCV: 87.2 fL (ref 80.0–100.0)
Platelets: 306 10*3/uL (ref 150–400)
RBC: 5.47 MIL/uL (ref 4.22–5.81)
RDW: 12.5 % (ref 11.5–15.5)
WBC: 10.3 10*3/uL (ref 4.0–10.5)
nRBC: 0 % (ref 0.0–0.2)

## 2023-05-24 LAB — LIPASE, BLOOD: Lipase: 26 U/L (ref 11–51)

## 2023-05-24 MED ORDER — ONDANSETRON HCL 4 MG/2ML IJ SOLN
4.0000 mg | Freq: Once | INTRAMUSCULAR | Status: AC
  Administered 2023-05-24: 4 mg via INTRAVENOUS
  Filled 2023-05-24: qty 2

## 2023-05-24 MED ORDER — ONDANSETRON 4 MG PO TBDP: 4.0000 mg | ORAL_TABLET | Freq: Three times a day (TID) | ORAL | 0 refills | Status: AC | PRN

## 2023-05-24 MED ORDER — SODIUM CHLORIDE 0.9 % IV BOLUS
1000.0000 mL | Freq: Once | INTRAVENOUS | Status: AC
  Administered 2023-05-24: 1000 mL via INTRAVENOUS

## 2023-05-24 NOTE — Discharge Instructions (Addendum)
Please refer to the attached instructions. Zofran as directed for nausea and/or vomiting.

## 2023-05-24 NOTE — ED Notes (Signed)
ED Provider at bedside.

## 2023-05-24 NOTE — ED Triage Notes (Signed)
Pt woke up emesis, not able to keep anything down.  5-6 times today. + diarrhea.  Pt seen on 1/26 for emesis and syncope.

## 2023-05-24 NOTE — ED Provider Notes (Signed)
[place] EMERGENCY DEPARTMENT AT [facility] Provider Note   CSN: [id number] Arrival date & time: 05/24/23  1545     History  Chief Complaint  Patient presents with   Emesis    [name] [name] is a 28 y.o. male.  Patient presents with complaint of emesis, onset today. Patient is also requesting STD screening. He is sexually active. Denies dysuria, urethral discharge.  The history is provided by the patient.  Emesis Severity:  Moderate Duration:  1 day Timing:  Intermittent Quality:  Stomach contents Progression:  Unchanged Chronicity:  New Ineffective treatments:  None tried Associated symptoms: abdominal pain   Associated symptoms: no chills, no cough, no diarrhea, no fever and no URI   Risk factors: no alcohol use        Home Medications Prior to Admission medications   Medication Sig Start Date End Date Taking? Authorizing Provider  cephALEXin (KEFLEX) 500 MG capsule Take 1 capsule (500 mg total) by mouth 4 (four) times daily. 06/03/22   Clark, Meghan R, PA-C  ibuprofen (ADVIL,MOTRIN) 600 MG tablet Take 1 tablet (600 mg total) by mouth every 8 (eight) hours as needed for moderate pain. 07/17/15   Burgess Amor, PA-C  ondansetron (ZOFRAN ODT) 4 MG disintegrating tablet Take 1 tablet (4 mg total) by mouth every 8 (eight) hours as needed for nausea or vomiting. 06/01/18   Janace Aris, FNP  traMADol (ULTRAM) 50 MG tablet Take 1 tablet (50 mg total) by mouth every 6 (six) hours as needed. 06/03/22   Melton Alar R, PA-C      Allergies    Patient has no known allergies.    Review of Systems   Review of Systems  Constitutional:  Negative for chills and fever.  Respiratory:  Negative for cough.   Gastrointestinal:  Positive for abdominal pain, nausea and vomiting. Negative for diarrhea.  All other systems reviewed and are negative.   Physical Exam Updated Vital Signs BP (!) 149/77   Pulse 61   Temp 99 F (37.2 C) (Oral)   Resp 18   Ht 5\' 10"  (1.778 m)    Wt 77.1 kg   SpO2 97%   BMI 24.39 kg/m  Physical Exam Constitutional:      Appearance: Normal appearance. He is not ill-appearing.  HENT:     Head: Normocephalic.     Mouth/Throat:     Mouth: Mucous membranes are moist.  Eyes:     Conjunctiva/sclera: Conjunctivae normal.  Cardiovascular:     Rate and Rhythm: Normal rate.     Pulses: Normal pulses.  Pulmonary:     Effort: Pulmonary effort is normal.     Breath sounds: Normal breath sounds.  Abdominal:     Palpations: Abdomen is soft.     Tenderness: There is abdominal tenderness.  Musculoskeletal:        General: Normal range of motion.  Skin:    General: Skin is warm and dry.     Findings: No rash.  Neurological:     Mental Status: He is alert and oriented to person, place, and time.  Psychiatric:        Mood and Affect: Mood normal.        Behavior: Behavior normal.     ED Results / Procedures / Treatments   Labs (all labs ordered are listed, but only abnormal results are displayed) Labs Reviewed  COMPREHENSIVE METABOLIC PANEL - Abnormal; Notable for the following components:      Result Value  Glucose, Bld 107 (*)    All other components within normal limits  URINALYSIS, ROUTINE W REFLEX MICROSCOPIC - Abnormal; Notable for the following components:   APPearance HAZY (*)    Leukocytes,Ua TRACE (*)    Bacteria, UA RARE (*)    All other components within normal limits  LIPASE, BLOOD  CBC    EKG None  Radiology No results found.  Procedures Procedures    Medications Ordered in ED Medications  sodium chloride 0.9 % bolus 1,000 mL (1,000 mLs Intravenous New Bag/Given 05/24/23 2205)  ondansetron [facility]) injection 4 mg (4 mg Intravenous Given 05/24/23 2205)    ED Course/ Medical Decision Making/ A&P                                 Medical Decision Making Amount and/or Complexity of Data Reviewed Labs: ordered.   Patient is nontoxic, nonseptic appearing, in no current distress.  Labs and vitals  reviewed.  Patient does not meet the SIRS or Sepsis criteria.  On repeat exam patient does not have a surgical abdomen and there are no peritoneal signs.  Patient feeling better after fluid bolus and zofran. Patient tolerating oral fluids.  Patient discharged home with symptomatic treatment and given strict instructions for follow-up with their primary care physician.  I have also discussed reasons to return immediately to the ER.  Patient expresses understanding and agrees with plan.   STD screening obtained at patient request. Patient will be contacted if results indicate additional treatment is needed.         Final Clinical Impression(s) / ED Diagnoses Final diagnoses:  Nausea and vomiting, unspecified vomiting type    Rx / DC Orders ED Discharge Orders     None         Felicie Morn, NP 05/24/23 2319    Gloris Manchester, MD 05/25/23 [phone]

## 2023-05-25 LAB — HIV ANTIBODY (ROUTINE TESTING W REFLEX): HIV Screen 4th Generation wRfx: NONREACTIVE

## 2023-05-25 LAB — RPR: RPR Ser Ql: NONREACTIVE

## 2023-05-26 ENCOUNTER — Telehealth (HOSPITAL_COMMUNITY): Payer: Self-pay

## 2023-05-26 LAB — GC/CHLAMYDIA PROBE AMP (~~LOC~~) NOT AT ARMC
Chlamydia: POSITIVE — AB
Comment: NEGATIVE
Comment: NORMAL
Neisseria Gonorrhea: NEGATIVE

## 2023-05-26 MED ORDER — AZITHROMYCIN 500 MG PO TABS
1000.0000 mg | ORAL_TABLET | Freq: Once | ORAL | 0 refills | Status: AC
Start: 2023-05-26 — End: 2023-05-26

## 2023-10-09 ENCOUNTER — Encounter (HOSPITAL_COMMUNITY): Payer: Self-pay

## 2023-10-09 ENCOUNTER — Other Ambulatory Visit: Payer: Self-pay

## 2023-10-09 ENCOUNTER — Emergency Department (HOSPITAL_COMMUNITY)
Admission: EM | Admit: 2023-10-09 | Discharge: 2023-10-09 | Disposition: A | Discharge status: Home / Self Care | Payer: Self-pay | Attending: Emergency Medicine | Admitting: Emergency Medicine

## 2023-10-09 DIAGNOSIS — Z113 Encounter for screening for infections with a predominantly sexual mode of transmission: Secondary | ICD-10-CM | POA: Insufficient documentation

## 2023-10-09 LAB — RAPID HIV SCREEN (HIV 1/2 AB+AG)
HIV 1/2 Antibodies: NONREACTIVE
HIV-1 P24 Antigen - HIV24: NONREACTIVE

## 2023-10-09 NOTE — Discharge Instructions (Addendum)
 You will be contacted if your results are positive otherwise you do not need any specific treatment unless your results come back positive.  You can go to the health department in the future to have STD testing done, you do not need to come to the emergency department for that  [facility] the MyChart app, your results will come directly to your app and you could follow-up with them in the next 1 to 2 days

## 2023-10-09 NOTE — ED Triage Notes (Signed)
 Pt stated that he wants an STD test done. No recent exposure just wants to be checked

## 2023-10-09 NOTE — ED Provider Notes (Signed)
 [place] EMERGENCY DEPARTMENT AT [facility] Provider Note   CSN: [id number] Arrival date & time: 10/09/23  1135     Patient presents with: No chief complaint on file.   Catherine D [place] is a 28 y.o. male.   HPI   This patient is a 28 year old male denies any chronic medical conditions and states that he takes no medications.  He presents to the hospital after his girlfriend requested that he be tested for STDs.  He states that he was exposed to and treated for chlamydia in the past but has not recently had any symptoms, no burning no discharge no penile pain rashes or scrotal pain or rashes no abdominal pain or fevers.  He reports that he only wants to be checked for her, he does not have any symptoms.  Prior to Admission medications   Medication Sig Start Date End Date Taking? Authorizing Provider  cephALEXin  (KEFLEX ) 500 MG capsule Take 1 capsule (500 mg total) by mouth 4 (four) times daily. 06/03/22   Clark, Meghan R, PA-C  ibuprofen  (ADVIL ,MOTRIN ) 600 MG tablet Take 1 tablet (600 mg total) by mouth every 8 (eight) hours as needed for moderate pain. 07/17/15   Idol, Julie, PA-C  ondansetron  (ZOFRAN  ODT) 4 MG disintegrating tablet Take 1 tablet (4 mg total) by mouth every 8 (eight) hours as needed for nausea or vomiting. 06/01/18   Jerri Morale A, FNP  ondansetron  (ZOFRAN -ODT) 4 MG disintegrating tablet Take 1 tablet (4 mg total) by mouth every 8 (eight) hours as needed for nausea. 05/24/23   Gigi Kyle, NP  traMADol  (ULTRAM ) 50 MG tablet Take 1 tablet (50 mg total) by mouth every 6 (six) hours as needed. 06/03/22   Clark, Meghan R, PA-C    Allergies: Patient has no known allergies.    Review of Systems  All other systems reviewed and are negative.   Updated Vital Signs BP 137/88   Pulse 66   Temp 98.1 F (36.7 C) (Oral)   Resp 17   Ht 1.778 m (5' 10)   Wt 77.6 kg   SpO2 100%   BMI 24.54 kg/m   Physical Exam Vitals and nursing note reviewed.  Constitutional:       Appearance: He is well-developed. He is not diaphoretic.  HENT:     Head: Normocephalic and atraumatic.   Eyes:     General:        Right eye: No discharge.        Left eye: No discharge.     Conjunctiva/sclera: Conjunctivae normal.   Pulmonary:     Effort: Pulmonary effort is normal. No respiratory distress.   Skin:    General: Skin is warm and dry.     Findings: No erythema or rash.   Neurological:     Mental Status: He is alert.     Coordination: Coordination normal.     (all labs ordered are listed, but only abnormal results are displayed) Labs Reviewed  RPR  RAPID HIV SCREEN (HIV 1/2 AB+AG)  GC/CHLAMYDIA PROBE AMP ([place]) NOT AT [facility]    EKG: None  Radiology: No results found.   Procedures   Medications Ordered in the ED - No data to display                                  Medical Decision Making Amount and/or Complexity of Data Reviewed Labs: ordered.   Patient  has no complaints, STD testing will be sent, the patient was informed that he would need to follow-up his own results through MyChart and we would only contact him for positive results.  I do not think he needs prophylactic treatment at this time as he has not recently been exposed or had any symptoms.  The patient is agreeable to the plan.  He was also informed that he can go to the health department to have this done in the future     Final diagnoses:  None    ED Discharge Orders     None          Early Glisson, MD 10/09/23 1151

## 2023-10-10 LAB — GC/CHLAMYDIA PROBE AMP (~~LOC~~) NOT AT ARMC
Chlamydia: NEGATIVE
Comment: NEGATIVE
Comment: NORMAL
Neisseria Gonorrhea: NEGATIVE

## 2023-10-10 LAB — RPR: RPR Ser Ql: NONREACTIVE
